# Patient Record
Sex: Male | Born: 1980 | Race: White | Hispanic: No
Health system: Southern US, Community
[De-identification: ages and names within clinical notes are randomized; demographics above are authoritative.]

---

## 2015-09-19 ENCOUNTER — Ambulatory Visit (INDEPENDENT_AMBULATORY_CARE_PROVIDER_SITE_OTHER): Payer: BLUE CROSS/BLUE SHIELD

## 2015-09-19 ENCOUNTER — Ambulatory Visit (INDEPENDENT_AMBULATORY_CARE_PROVIDER_SITE_OTHER): Payer: BLUE CROSS/BLUE SHIELD | Admitting: Sports Medicine

## 2015-09-19 DIAGNOSIS — M50323 Other cervical disc degeneration at C6-C7 level: Secondary | ICD-10-CM | POA: Diagnosis not present

## 2015-09-19 DIAGNOSIS — M5412 Radiculopathy, cervical region: Secondary | ICD-10-CM

## 2015-09-19 MED ORDER — MELOXICAM 15 MG PO TABS: ORAL_TABLET | ORAL | 3 refills | Status: AC

## 2015-09-19 MED ORDER — PREDNISONE 50 MG PO TABS: ORAL_TABLET | ORAL | 0 refills | Status: DC

## 2015-09-19 MED ORDER — MELOXICAM 15 MG PO TABS: ORAL_TABLET | ORAL | 3 refills | Status: DC

## 2015-09-19 MED ORDER — PREDNISONE 50 MG PO TABS: ORAL_TABLET | ORAL | 0 refills | Status: AC

## 2015-09-19 NOTE — Assessment & Plan Note (Signed)
Right C6 distribution to the thumb. Physical therapy, x-rays, prednisone, meloxicam. Return to see me in one month, MRI for intervention if no better however I do think we will simply need to add gabapentin if persistent symptoms

## 2015-09-19 NOTE — Progress Notes (Signed)
   Subjective:    I'm seeing this patient as a consultation for:  Dr. 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 Numbness and tingling. Moderate, persistent. No bowel or bladder dysfunction, saddle numbness, constitutional symptoms, no trauma. No lower extremity weakness.  Past medical history, Surgical history, Family history not pertinant except as noted below, Social history, Allergies, and medications have been entered into the medical record, reviewed, and no changes needed.   Review of Systems: No headache, visual changes, nausea, vomiting, diarrhea, constipation, dizziness, abdominal pain, skin rash, fevers, chills, night sweats, weight loss, swollen lymph nodes, body aches, joint swelling, muscle aches, chest pain, shortness of breath, mood changes, visual or auditory hallucinations.   Objective:   General: Well Developed, well nourished, and in no acute distress.  Neuro/Psych: Alert and oriented x3, extra-ocular muscles intact, able to move all 4 extremities, sensation grossly intact. Skin: Warm and dry, no rashes noted.  Respiratory: Not using accessory muscles, speaking in full sentences, trachea midline.  Cardiovascular: Pulses palpable, no extremity edema. Abdomen: Does not appear distended. Neck: Negative spurling's Full neck range of motion Grip strength and sensation normal in bilateral hands Strength good C4 to T1 distribution No sensory change to C4 to T1 Reflexes normal  Impression and Recommendations:   This case required medical decision making of moderate complexity.

## 2017-02-01 IMAGING — DX DG CERVICAL SPINE COMPLETE 4+V
5 series · 5 of 5 positions shown · non-contrast
Comparison: None in PACs

CLINICAL DATA: Right-sided cervical radiculopathy for 1-2 months
without known injury

EXAM:
CERVICAL SPINE - COMPLETE 4+ VIEW

[c-spine lat]
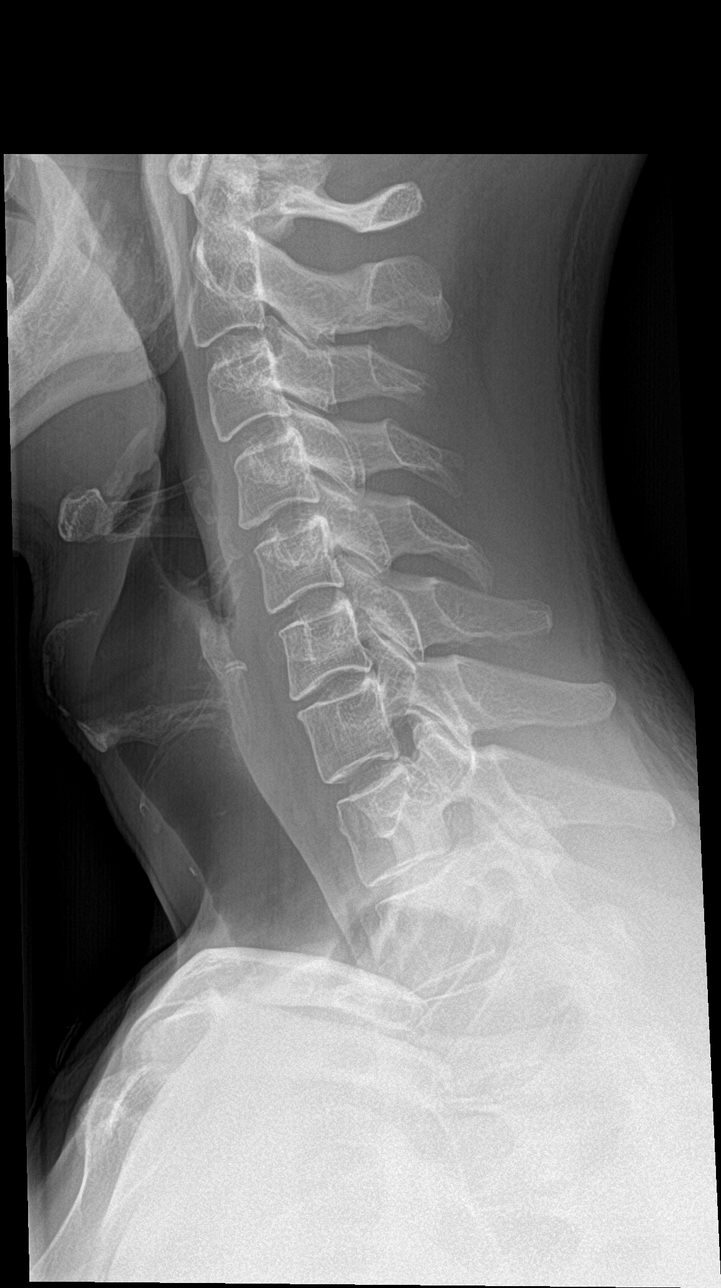

[c-spine obl (1 of 2)]
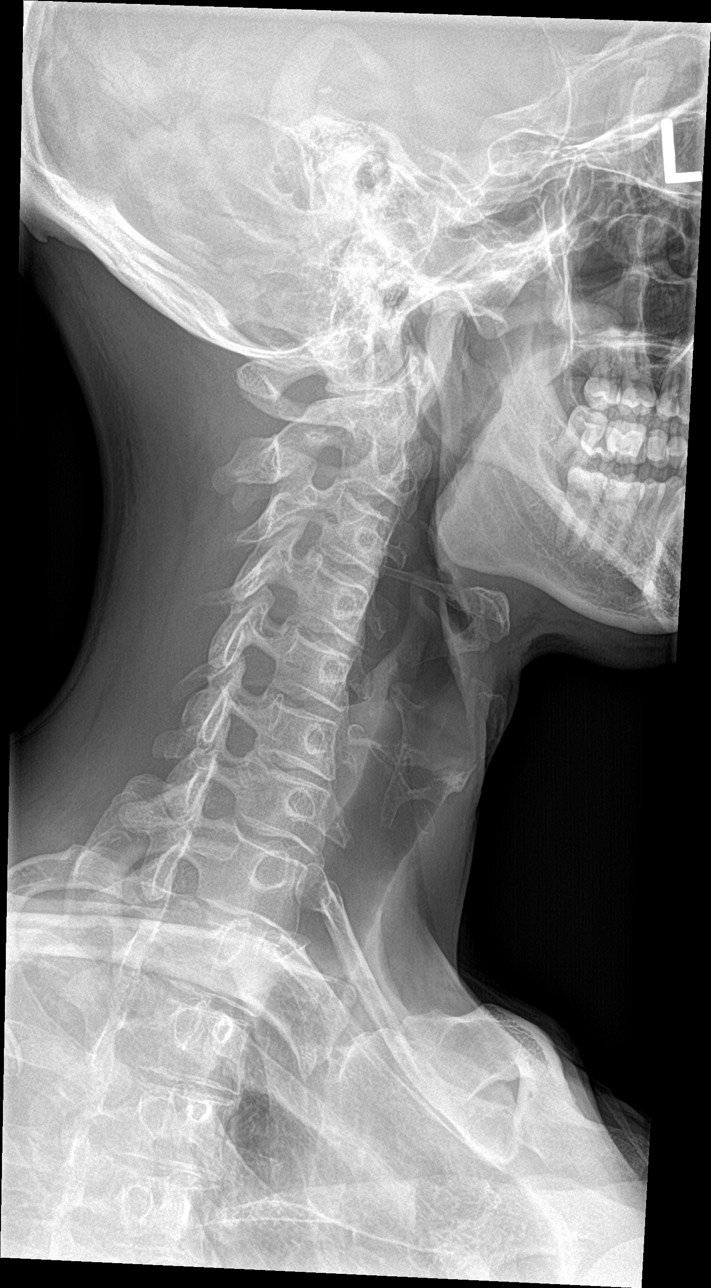

[c-spine obl (2 of 2)]
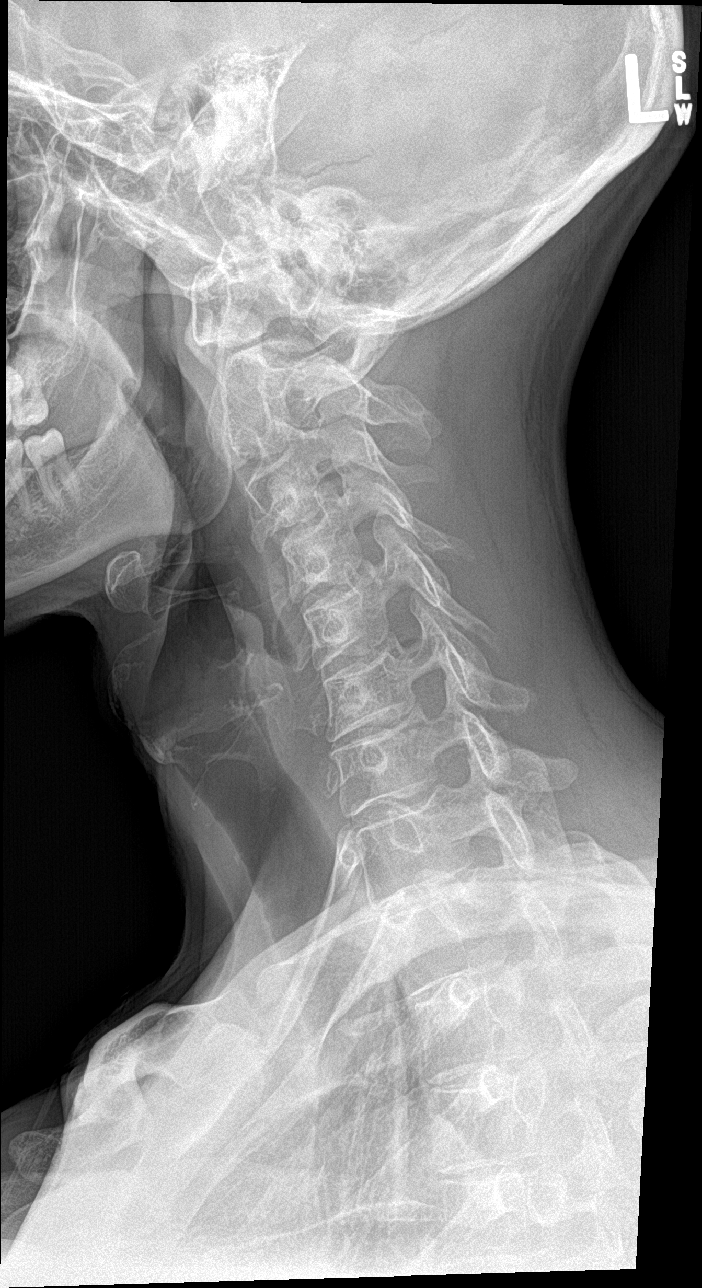

[c-spine ap]
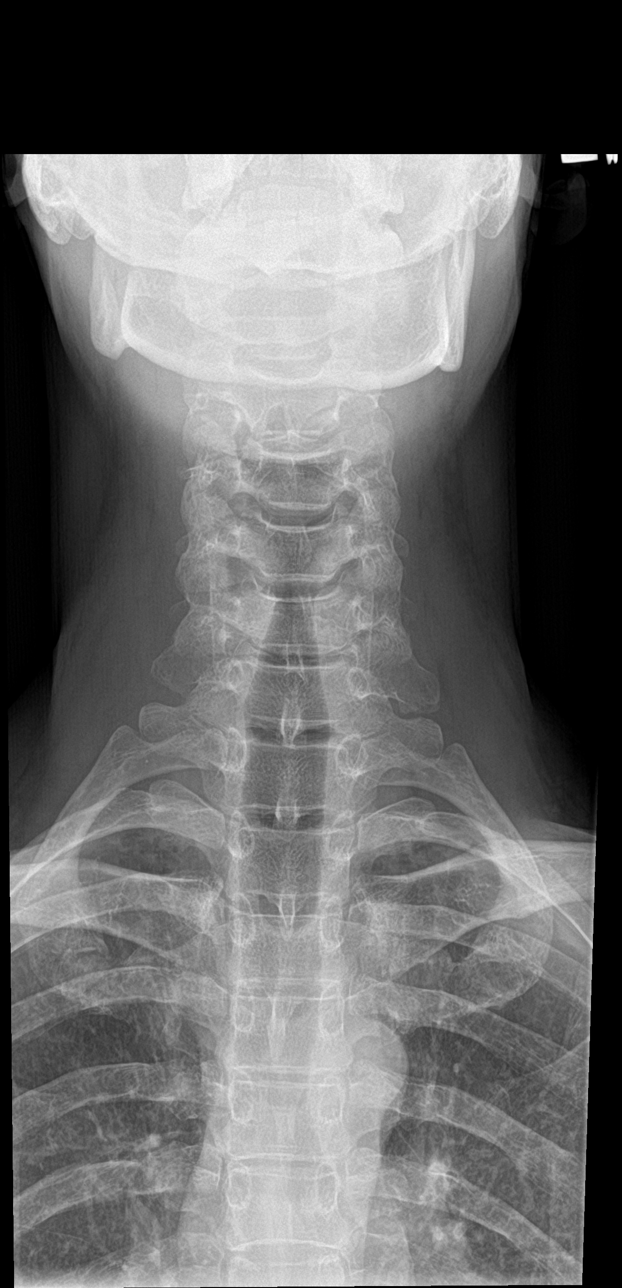

[c-spine open mouth]
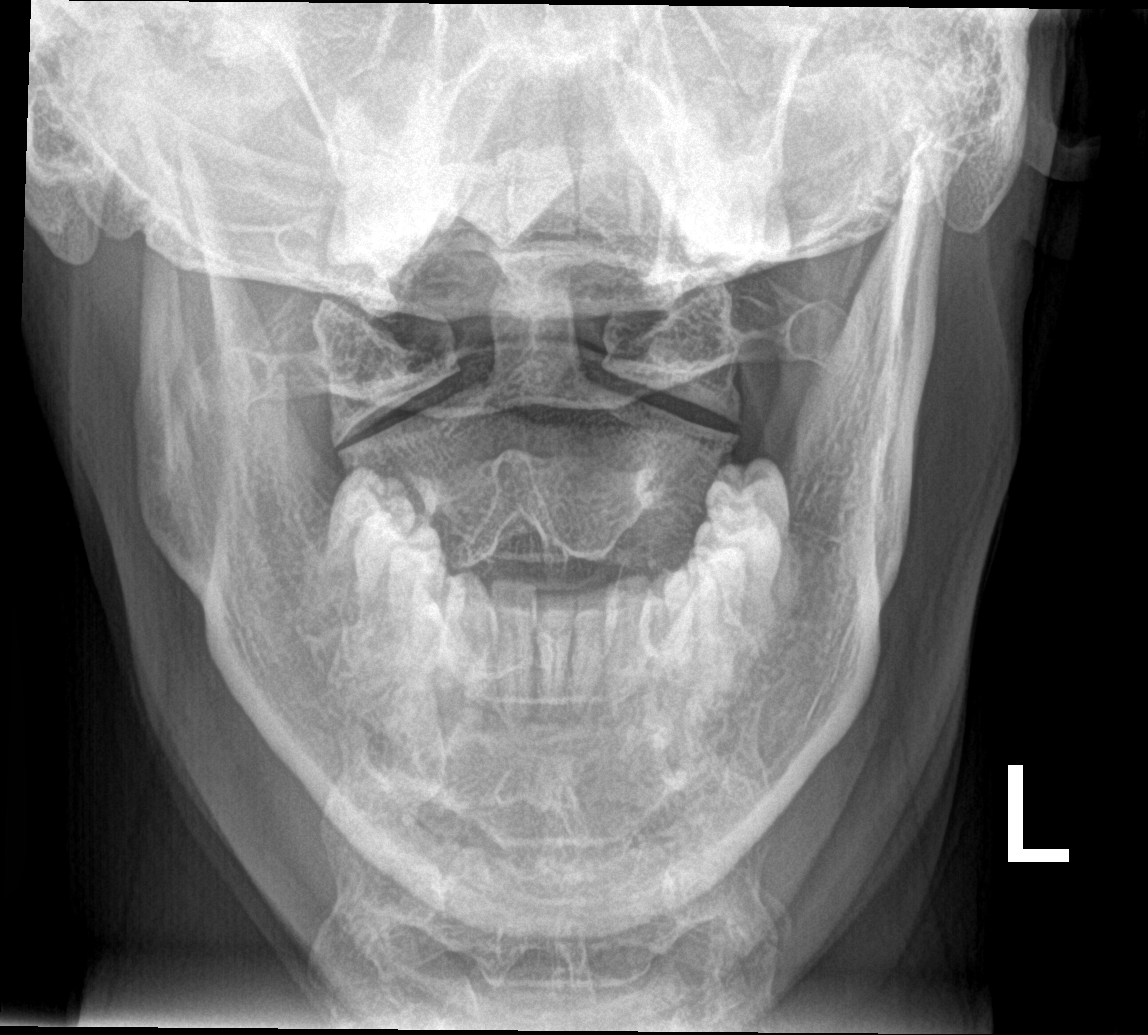

[5 of 5 positions shown; findings below may reference images not displayed]

FINDINGS: The cervical vertebral bodies are preserved in height. There is mild
disc space narrowing at C6-7. The prevertebral soft tissue spaces
are normal. There is no perched facet. The oblique views reveal mild
encroachment upon the right C3 neural foramen and moderate
encroachment on the left C2 neural foramen. The prevertebral soft
tissue spaces are normal. The spinous processes and the odontoid are
intact.
IMPRESSION: There is degenerative mild degenerative disc disease at C6-7. There
is neural foramina and approach but on the right at C3 and on the
left at C2 due to uncovertebral joint and facet joint hypertrophy.

Given the patient's symptoms, cervical spine MRI would be useful if
the patient can undergo the procedure.
